# Patient Record
Sex: Female | Born: 1970 | Hispanic: Yes | Marital: Married | Smoking: Never smoker
Health system: Southern US, Community
[De-identification: ages and names within clinical notes are randomized; demographics above are authoritative.]

## PROBLEM LIST (undated history)

## (undated) ENCOUNTER — Inpatient Hospital Stay (HOSPITAL_COMMUNITY): Payer: Self-pay

## (undated) ENCOUNTER — Inpatient Hospital Stay (HOSPITAL_COMMUNITY): Payer: BC Managed Care – PPO

## (undated) DIAGNOSIS — Z789 Other specified health status: Secondary | ICD-10-CM

---

## 2012-01-18 LAB — OB RESULTS CONSOLE RUBELLA ANTIBODY, IGM: Rubella: IMMUNE

## 2012-01-26 LAB — OB RESULTS CONSOLE GC/CHLAMYDIA
Chlamydia: NEGATIVE
Gonorrhea: NEGATIVE

## 2012-06-01 ENCOUNTER — Observation Stay (HOSPITAL_COMMUNITY)
Admission: AD | Admit: 2012-06-01 | Discharge: 2012-06-03 | Disposition: A | Discharge status: Home / Self Care | Payer: BC Managed Care – PPO | Source: Ambulatory Visit | Attending: Obstetrics and Gynecology | Admitting: Obstetrics and Gynecology

## 2012-06-01 ENCOUNTER — Encounter (HOSPITAL_COMMUNITY): Payer: Self-pay

## 2012-06-01 DIAGNOSIS — O4690 Antepartum hemorrhage, unspecified, unspecified trimester: Principal | ICD-10-CM | POA: Diagnosis present

## 2012-06-01 HISTORY — DX: Other specified health status: Z78.9

## 2012-06-01 LAB — OB RESULTS CONSOLE ABO/RH: RH Type: POSITIVE

## 2012-06-01 LAB — OB RESULTS CONSOLE ANTIBODY SCREEN: Antibody Screen: NEGATIVE

## 2012-06-02 ENCOUNTER — Encounter (HOSPITAL_COMMUNITY): Payer: Self-pay

## 2012-06-02 ENCOUNTER — Inpatient Hospital Stay (HOSPITAL_COMMUNITY): Payer: BC Managed Care – PPO

## 2012-06-02 DIAGNOSIS — O4690 Antepartum hemorrhage, unspecified, unspecified trimester: Secondary | ICD-10-CM | POA: Diagnosis present

## 2012-06-02 LAB — URINALYSIS, ROUTINE W REFLEX MICROSCOPIC
Leukocytes, UA: NEGATIVE
Nitrite: NEGATIVE
Protein, ur: NEGATIVE mg/dL
Specific Gravity, Urine: <1.005 — ABNORMAL LOW (ref 1.005–1.030)
Urobilinogen, UA: 0.2 mg/dL (ref 0.0–1.0)

## 2012-06-02 LAB — CBC
Hemoglobin: 11.8 g/dL — ABNORMAL LOW (ref 12.0–15.0)
MCH: 32.4 pg (ref 26.0–34.0)
Platelets: 132 10*3/uL — ABNORMAL LOW (ref 150–400)
RBC: 3.64 MIL/uL — ABNORMAL LOW (ref 3.87–5.11)

## 2012-06-02 LAB — ABO/RH: ABO/RH(D): O POS

## 2012-06-02 LAB — URINE MICROSCOPIC-ADD ON

## 2012-06-02 LAB — GROUP B STREP BY PCR: Group B strep by PCR: NEGATIVE

## 2012-06-02 MED ORDER — LACTATED RINGERS IV SOLN
INTRAVENOUS | Status: DC
  Administered 2012-06-02 (×2): via INTRAVENOUS

## 2012-06-02 MED ORDER — BETAMETHASONE SOD PHOS & ACET 6 (3-3) MG/ML IJ SUSP
12.0000 mg | Freq: Once | INTRAMUSCULAR | Status: AC
Start: 2012-06-02 — End: 2012-06-02
  Administered 2012-06-02: 12 mg via INTRAMUSCULAR
  Filled 2012-06-02: qty 2

## 2012-06-02 MED ORDER — NIFEDIPINE 10 MG PO CAPS
10.0000 mg | ORAL_CAPSULE | Freq: Once | ORAL | Status: AC
  Administered 2012-06-02: 10 mg via ORAL
  Filled 2012-06-02: qty 1

## 2012-06-02 MED ORDER — BETAMETHASONE SOD PHOS & ACET 6 (3-3) MG/ML IJ SUSP
12.0000 mg | Freq: Once | INTRAMUSCULAR | Status: AC
  Administered 2012-06-03: 12 mg via INTRAMUSCULAR
  Filled 2012-06-02: qty 2

## 2012-06-02 MED ORDER — IBUPROFEN 600 MG PO TABS
600.0000 mg | ORAL_TABLET | Freq: Once | ORAL | Status: AC
  Administered 2012-06-02: 600 mg via ORAL
  Filled 2012-06-02: qty 1

## 2012-06-02 MED ORDER — ZOLPIDEM TARTRATE 5 MG PO TABS: 5.0000 mg | ORAL_TABLET | Freq: Every evening | ORAL | Status: DC | PRN

## 2012-06-02 MED ORDER — NIFEDIPINE 10 MG PO CAPS
10.0000 mg | ORAL_CAPSULE | ORAL | Status: DC
Start: 2012-06-02 — End: 2012-06-03
  Administered 2012-06-02 – 2012-06-03 (×8): 10 mg via ORAL
  Filled 2012-06-02 (×8): qty 1

## 2012-06-02 MED ORDER — IBUPROFEN 100 MG/5ML PO SUSP
600.0000 mg | Freq: Once | ORAL | Status: DC
  Filled 2012-06-02: qty 30

## 2012-06-02 NOTE — Progress Notes (Signed)
Updated that fetal fibronectin came back positive, orders received for Betamethazone

## 2012-06-02 NOTE — MAU Note (Signed)
Pt states saw red blood in underwear just before arriving at MAU. Denies contractions or pain. Denies leaking of fluid. Decreased fetal movement today.

## 2012-06-02 NOTE — H&P (Signed)
NAMESHAWNAE, LEIVA NO.:  [id number]  MEDICAL RECORD NO.:  [id number]  LOCATION:  9173                          FACILITY:  [facility]  PHYSICIAN:  Lenoard Aden, M.D.DATE OF BIRTH:  May 21, 1971  DATE OF ADMISSION:  06/01/2012 DATE OF DISCHARGE:                             HISTORY & PHYSICAL   CHIEF COMPLAINT:  One episode of bright red spotting in the evening of June 02, 2012.  No heavy bleeding, no leakage of fluid, good fetal movement.  HISTORY OF PRESENT ILLNESS:  She is a 41 year old female, G2, P1, history of previous low-segment transverse cesarean section, who presents at 29-6/7th weeks spotting of unknown origin.  She has no known drug allergies.  MEDICATIONS:  Prenatal vitamins.  She is a nonsmoker, nondrinker.  She denies domestic or physical violence.  She has had previous cesarean section done urgently at 38 weeks and prefers a repeat C-section with this pregnancy.  She has had no other medical or surgical hospitalizations.  PHYSICAL EXAMINATION:  GENERAL:  She is a well-developed, well-nourished female, in no acute distress. HEENT:  Normal. NECK:  Supple.  Full range of motion. LUNGS:  Clear. HEART:  Regular rate and rhythm. ABDOMEN:  Soft, gravid, nontender.  No CVA tenderness. EXTREMITIES:  There are no cords. NEUROLOGIC:  Nonfocal. PELVIC:  Per RN reveals no bleeding.  Cervix to be close, long, out of the pelvis.  Fetal heart rate tracings in the 140-150 beats per minute range with good accelerations, no decelerations.  Initially, contractions were every 5 minutes.  Now after Procardia tocolysis, the contractions have ceased.  Ultrasound revealed normal amniotic fluid index, normal-appearing placenta, and normal cervical length.  CBC and PIH panel are pending. Fetal fibronectin is positive.  Betamethasone #1 is given.  IMPRESSION: 1. A 29-6/7th-week intrauterine pregnancy. 2. Unexplained third-trimester bleeding, questionable  marginal     abruption versus preterm labor.  PLAN:  Betamethasone series.  We will consider magnesium sulfate, neuro prophylaxis, continuous EFM and toco.  Inpatient hospitalization at this time.  Continue Procardia tocolysis.  We will treat empirically group B strep at this time.     Lenoard Aden, M.D.     RJT/MEDQ  D:  06/02/2012  T:  06/02/2012  Job:  [id number]

## 2012-06-02 NOTE — Progress Notes (Signed)
Pt denies any bleeding, contractions. Denies any needs at this time.

## 2012-06-02 NOTE — Progress Notes (Signed)
Dr. Billy Coast called notified of pt stats and uc pattern, orders to collect fetal fibronectin, collect GBS, and check pt cervix. Orders for Procardia 10mg  PO q 4 hrs.

## 2012-06-02 NOTE — Progress Notes (Signed)
Antepartum note HD 1  S:  Reports feeling ok             Tolerating po/ No nausea or vomiting             Bleeding is only seen when wiping after BRP             No cramping or abdominal pain             BR with BRP  Small pink after yoga class Friday then onset red bleeding after intercourse last night  O:               VS: BP 101/43  Pulse 72  Temp 98 F (36.7 C) (Oral)  Resp 18  Ht 5\' 6"  (1.676 m)  Wt 72 kg (158 lb 11.7 oz)  BMI 25.62 kg/m2   LABS:  Basename 06/02/12 0206  WBC 8.9  HGB 11.8*  PLT 132*                                       Physical Exam:             Alert and oriented X3 / limited [place] - husband translating  Lungs: Clear and unlabored  Heart: regular rate and rhythm   Abdomen: soft, non-tender, non-distended              Fundus: non-tender / soft  Lochia: no bleeding on peripad                   FHR category 1 / no ctx per EFM     A: 29 6/[redacted] weeks pregnant             Bleeding in pregnancy - unidentified etiology at this time   Doing well - stable status  P:  Continue inpt observation             Complete BMZ course             Likely false fFn with blood and semen in vagina - consider recollection at office next week             MD to follow  Marlinda Mike CNM, MSN 06/02/2012, 9:29 AM

## 2012-06-02 NOTE — Progress Notes (Signed)
Patient ID: Koi Brandli, female   DOB: 07/10/1971, 41 y.o.   MRN: [id number] 29 6/7 weeks  S: Presents with one episode of Bright red spotting Good FM. Denies contractions. No LOF.  O: BP 101/43  Pulse 72  Temp 98 F (36.7 C) (Oral)  Resp 18  Ht 5\' 6"  (1.676 m)  Wt 72 kg (158 lb 11.7 oz)  BMI 25.62 kg/m2  Lgs: CTA CV: RRR ABD: Gravid  And non tender No CVAT EXT: neg c/c/e Neuro: nonfocal VE: CL/ LG/OOP Skin: intact  FHT 140-150, BTBV 5-25, 10x10 accels, rare contractions Sono- nl AFI, NL placenta, Nl CL CBC pending  A: 29 6/7 wk IUP Third trimester bleeding- No s/s abruptio. ? Marginal separation. ?PTL FFN positive  P: Will get complete OB sono Will ck DIC panel Will consider Neuroprophylaxis EFM/Toco BMZ series. Procardia

## 2012-06-02 NOTE — Progress Notes (Signed)
Patient ID: Julia Barrett, female   DOB: July 12, 1970, 41 y.o.   MRN: [id number] Discussed with patient. Pt had IC prior to bleeding. FFN possibly false positive. Will continue with plan of care. BMZ #2 tomorrow .  Will consider dc at that time.

## 2012-06-02 NOTE — Progress Notes (Signed)
Dr. Billy Coast called notified of pt status and uc pattern, orders received

## 2012-06-03 LAB — URINE MICROSCOPIC-ADD ON

## 2012-06-03 LAB — URINALYSIS, ROUTINE W REFLEX MICROSCOPIC
Glucose, UA: NEGATIVE mg/dL
Leukocytes, UA: NEGATIVE
Nitrite: NEGATIVE
Protein, ur: NEGATIVE mg/dL
pH: 6 (ref 5.0–8.0)

## 2012-06-03 MED ORDER — NITROFURANTOIN MONOHYD MACRO 100 MG PO CAPS: 100.0000 mg | ORAL_CAPSULE | Freq: Two times a day (BID) | ORAL | Status: DC

## 2012-06-03 MED ORDER — IBUPROFEN 600 MG PO TABS
600.0000 mg | ORAL_TABLET | Freq: Once | ORAL | Status: AC
  Administered 2012-06-03: 600 mg via ORAL
  Filled 2012-06-03: qty 1

## 2012-06-03 NOTE — Progress Notes (Signed)
Pt also requesting to have  UA done

## 2012-06-03 NOTE — Progress Notes (Signed)
Patient ID: Julia Barrett, female   DOB: 1971-05-05, 41 y.o.   MRN: [id number] HD#2 30 weeks Third trimester bleeding, pos FFN  S: Pos FM, Low back pain improved. NO bleeding or LOF No CP or SOB  O: BP 101/56  Pulse 74  Temp 98.2 F (36.8 C) (Oral)  Resp 18  Ht 5\' 6"  (1.676 m)  Wt 72 kg (158 lb 11.7 oz)  BMI 25.62 kg/m2  Lgs: CTA CV: RRR Abd: Gravid, NT Neg CVAT Ext: No c/c/e Pelvic : cl/50/-1 Brownish dc on glove No BRB noted  FHT 140-150, good BTBV, No decels, Rare Contractions Category I tracing  A: 30 week IUP Unexplained bleeding- pos FFN (?false pos due to blood) BMZ complete No evidence of PTL or abruptio  P: DC home Bedrest with BRP Send urine for CS Macrobid bid for 7d PTL precautions Bleeding precautions Fu office 3d

## 2012-06-03 NOTE — Progress Notes (Signed)
Taavon at bedside--SVE performed--discharge instructions discussed with pt and husband

## 2012-06-03 NOTE — Progress Notes (Signed)
Called dr Billy Coast, pt reguesting more ibuprofen and a UA, orders received.

## 2012-06-04 LAB — TYPE AND SCREEN
ABO/RH(D): O POS
Antibody Screen: NEGATIVE
Unit division: 0
Unit division: 0

## 2012-06-05 LAB — URINE CULTURE: Culture: NO GROWTH

## 2012-07-10 ENCOUNTER — Inpatient Hospital Stay (HOSPITAL_COMMUNITY)
Admission: AD | Admit: 2012-07-10 | Discharge: 2012-07-10 | Disposition: A | Discharge status: Home / Self Care | Payer: BC Managed Care – PPO | Source: Ambulatory Visit | Attending: Obstetrics | Admitting: Obstetrics

## 2012-07-10 ENCOUNTER — Encounter (HOSPITAL_COMMUNITY): Payer: Self-pay

## 2012-07-10 DIAGNOSIS — O4690 Antepartum hemorrhage, unspecified, unspecified trimester: Secondary | ICD-10-CM | POA: Insufficient documentation

## 2012-07-10 DIAGNOSIS — O99891 Other specified diseases and conditions complicating pregnancy: Secondary | ICD-10-CM | POA: Insufficient documentation

## 2012-07-10 LAB — AMNISURE RUPTURE OF MEMBRANE (ROM) NOT AT ARMC: Amnisure ROM: NEGATIVE

## 2012-07-10 NOTE — H&P (Signed)
Chief complaint: Vaginal discharge  History of present illness: 41 year old G2 P1 at 35 weeks and 2 days gestation who presents with  small fluid per vagina at 3 PM today. Patient reports no vaginal bleeding, no contractions, active fetal movement. Patient reports no unusual vaginal discharge but at 3 PM today had a single small gush of clear fluid. Patient notes no abdominal pain, no fevers. Patient states prenatal course is complicated by prior C-section planning repeat and unexplained third trimester vaginal bleeding for which she needed 2 days of hospitalization for earlier in this pregnancy. Patient has had no recurrence of her bleeding.  No known drug allergies  Social history. Married, from [place]. Nonsmoker, nondrinker  Physical exam: Filed Vitals:   07/10/12 1818 07/10/12 1839 07/10/12 1943  BP: 127/68 113/65 124/69  Pulse: 80 83 80  Temp: 98.7 F (37.1 C)    TempSrc: Oral    Resp: 16  20  Height: 5' 4.5" (1.638 m)    Weight: 76.295 kg (168 lb 3.2 oz)    SpO2: 99%     General: Comfortable, no distress Cardiovascular: Regular rate and rhythm, no murmur Pulmonary: Clear auscultation bilaterally Back: No costovertebral angle tenderness Abdomen: Gravid, nontender, no right upper quadrant pain GU: Normal vagina, no fluid in the vagina, no blood, cervix soft, fingertip, 20% effaced, vertex Lower extremity: No edema  Bedtime ultrasound: Anterior placenta, vertex presentation, grossly normal AFI, active fetal movement  And he is sure: Negative  Assessment and plan: 41 year old G2 P1 at 35 weeks and 2 days who presents with a single fluid leaking per vagina several hours ago. No evidence rupture of membranes. Reactive fetal testing, no evidence of active labor.  Discharge home reassurance given  Hampton Cost A. 07/10/2012 8:34 PM

## 2012-07-10 NOTE — MAU Note (Signed)
Patient is scheduled for a repeat cesarean on 1-28.

## 2012-07-10 NOTE — MAU Note (Signed)
Patient states she had a gush of clear fluid at 1600. Denies bleeding or contractions. States baby is moving but not as much as usual. Fetal heart tone in triage in the 130's with movement felt.

## 2012-07-11 NOTE — L&D Delivery Note (Signed)
Cesarean Section Procedure Note  Indications: previous uterine incision Kerr x 1, declines TOLAC  Pre-operative Diagnosis: 39 week 2 day pregnancy.  Post-operative Diagnosis: same  Surgeon: Genia Del   Assistants: Earl Gala  Anesthesia: Spinal anesthesia  ASA Class: 1   Procedure Details   The patient was seen in the Holding Room. The risks, benefits, complications, treatment options, and expected outcomes were discussed with the patient.  The patient concurred with the proposed plan, giving informed consent.  The site of surgery properly noted/marked. The patient was taken to Operating Room # 9, identified as Carriann Hesse and the procedure verified as C-Section Delivery. A Time Out was held and the above information confirmed.  After induction of anesthesia, the patient was draped and prepped in the usual sterile manner. A Pfannenstiel incision was made and carried down through the subcutaneous tissue to the fascia. Fascial incision was made and extended transversely. The fascia was separated from the underlying rectus tissue superiorly and inferiorly. The peritoneum was identified and entered. Peritoneal incision was extended longitudinally. The utero-vesical peritoneal reflection was incised transversely and the bladder flap was bluntly freed from the lower uterine segment. A low transverse uterine incision was made. Delivered from cephalic presentation at 12:05 was a  Female with Apgar scores of 9 at one minute and 10 at five minutes. After the umbilical cord was clamped and cut cord blood was obtained for evaluation. The placenta was removed intact and appeared normal. The uterine outline, tubes and ovaries appeared normal. The uterine incision was closed with running locked sutures of Vicryl 0.  A mattress suture of Vicryl 0 was added.  A figure of eight with Vicryl 0 was added for hemostasis on the left aspect of the uterine incision.  Hemostasis was observed. Lavage was  carried out until clear. 2 separate sutures of Vicryl 2-0 reapproximated the lower aspect of the recti muscles.  The fascia was then reapproximated with running sutures of Vicryl 0. The skin was reapproximated with Staples.  Instrument, sponge, and needle counts were correct prior the abdominal closure and at the conclusion of the case.    Estimated Blood Loss:  700 cc                Specimens: Placenta          Complications:  None; patient tolerated the procedure well.         Disposition: PACU - hemodynamically stable.         Condition: stable  Attending Attestation: I was present and scrubbed for the entire procedure.   Genia Del MD  08/07/2012 at 12:55

## 2012-07-24 ENCOUNTER — Other Ambulatory Visit: Payer: Self-pay | Admitting: Obstetrics & Gynecology

## 2012-07-27 ENCOUNTER — Other Ambulatory Visit (HOSPITAL_COMMUNITY): Payer: Self-pay | Admitting: Obstetrics & Gynecology

## 2012-07-27 DIAGNOSIS — O4100X0 Oligohydramnios, unspecified trimester, not applicable or unspecified: Secondary | ICD-10-CM

## 2012-07-27 DIAGNOSIS — O288 Other abnormal findings on antenatal screening of mother: Secondary | ICD-10-CM

## 2012-07-29 ENCOUNTER — Ambulatory Visit (HOSPITAL_COMMUNITY)
Admission: RE | Admit: 2012-07-29 | Discharge: 2012-07-29 | Disposition: A | Discharge status: Home / Self Care | Payer: BC Managed Care – PPO | Source: Ambulatory Visit | Attending: Obstetrics & Gynecology | Admitting: Obstetrics & Gynecology

## 2012-07-29 DIAGNOSIS — O288 Other abnormal findings on antenatal screening of mother: Secondary | ICD-10-CM

## 2012-07-29 DIAGNOSIS — O4190X0 Disorder of amniotic fluid and membranes, unspecified, unspecified trimester, not applicable or unspecified: Secondary | ICD-10-CM | POA: Insufficient documentation

## 2012-07-29 DIAGNOSIS — O4100X0 Oligohydramnios, unspecified trimester, not applicable or unspecified: Secondary | ICD-10-CM | POA: Insufficient documentation

## 2012-07-31 ENCOUNTER — Encounter (HOSPITAL_COMMUNITY): Payer: Self-pay

## 2012-08-01 ENCOUNTER — Encounter (HOSPITAL_COMMUNITY): Payer: Self-pay

## 2012-08-01 ENCOUNTER — Encounter (HOSPITAL_COMMUNITY)
Admission: RE | Admit: 2012-08-01 | Discharge: 2012-08-01 | Disposition: A | Discharge status: Home / Self Care | Payer: BC Managed Care – PPO | Source: Ambulatory Visit | Attending: Obstetrics & Gynecology | Admitting: Obstetrics & Gynecology

## 2012-08-01 ENCOUNTER — Encounter (HOSPITAL_COMMUNITY): Payer: Self-pay | Admitting: Pharmacist

## 2012-08-01 VITALS — BP 116/78 | Ht 64.5 in | Wt 170.0 lb

## 2012-08-01 DIAGNOSIS — O4690 Antepartum hemorrhage, unspecified, unspecified trimester: Secondary | ICD-10-CM

## 2012-08-01 LAB — CBC
Hemoglobin: 13.1 g/dL (ref 12.0–15.0)
MCH: 32.2 pg (ref 26.0–34.0)
MCHC: 34.6 g/dL (ref 30.0–36.0)
Platelets: 152 10*3/uL (ref 150–400)

## 2012-08-01 LAB — TYPE AND SCREEN: Antibody Screen: NEGATIVE

## 2012-08-01 LAB — SURGICAL PCR SCREEN: MRSA, PCR: NEGATIVE

## 2012-08-01 NOTE — Patient Instructions (Addendum)
20 Livy Malson  08/01/2012   Your procedure is scheduled on:  08/07/12  Enter through the Main Entrance of [facility] at 1000 AM.  Pick up the phone at the desk and dial [phone].   Call this number if you have problems the morning of surgery: [phone]   Remember:   Do not eat food:After Midnight.  Do not drink clear liquids: After Midnight.  Take these medicines the morning of surgery with A SIP OF WATER: NA   Do not wear jewelry, make-up or nail polish.  Do not wear lotions, powders, or perfumes. You may wear deodorant.  Do not shave 48 hours prior to surgery.  Do not bring valuables to the hospital.  Contacts, dentures or bridgework may not be worn into surgery.  Leave suitcase in the car. After surgery it may be brought to your room.  For patients admitted to the hospital, checkout time is 11:00 AM the day of discharge.   Patients discharged the day of surgery will not be allowed to drive home.  Name and phone number of your driver: NA  Special Instructions: Shower using CHG 2 nights before surgery and the night before surgery.  If you shower the day of surgery use CHG.  Use special wash - you have one bottle of CHG for all showers.  You should use approximately 1/3 of the bottle for each shower.   Please read over the following fact sheets that you were given: MRSA Information

## 2012-08-02 LAB — RPR: RPR Ser Ql: NONREACTIVE

## 2012-08-07 ENCOUNTER — Encounter (HOSPITAL_COMMUNITY): Payer: Self-pay | Admitting: *Deleted

## 2012-08-07 ENCOUNTER — Encounter (HOSPITAL_COMMUNITY)
Admission: RE | Disposition: A | Discharge status: Home / Self Care | Payer: Self-pay | Source: Ambulatory Visit | Attending: Obstetrics & Gynecology

## 2012-08-07 ENCOUNTER — Inpatient Hospital Stay (HOSPITAL_COMMUNITY)
Admission: RE | Admit: 2012-08-07 | Discharge: 2012-08-09 | DRG: 371 | Disposition: A | Discharge status: Home / Self Care | Payer: BC Managed Care – PPO | Source: Ambulatory Visit | Attending: Obstetrics & Gynecology | Admitting: Obstetrics & Gynecology

## 2012-08-07 ENCOUNTER — Inpatient Hospital Stay (HOSPITAL_COMMUNITY): Payer: BC Managed Care – PPO | Admitting: Anesthesiology

## 2012-08-07 ENCOUNTER — Encounter (HOSPITAL_COMMUNITY): Payer: Self-pay | Admitting: Anesthesiology

## 2012-08-07 DIAGNOSIS — O4690 Antepartum hemorrhage, unspecified, unspecified trimester: Secondary | ICD-10-CM

## 2012-08-07 DIAGNOSIS — Z98891 History of uterine scar from previous surgery: Secondary | ICD-10-CM | POA: Diagnosis present

## 2012-08-07 DIAGNOSIS — O34219 Maternal care for unspecified type scar from previous cesarean delivery: Principal | ICD-10-CM | POA: Diagnosis present

## 2012-08-07 DIAGNOSIS — O9902 Anemia complicating childbirth: Secondary | ICD-10-CM | POA: Diagnosis present

## 2012-08-07 DIAGNOSIS — O09529 Supervision of elderly multigravida, unspecified trimester: Secondary | ICD-10-CM | POA: Diagnosis present

## 2012-08-07 LAB — PREPARE RBC (CROSSMATCH)

## 2012-08-07 SURGERY — Surgical Case
Anesthesia: Spinal | Site: Abdomen | Wound class: Clean Contaminated

## 2012-08-07 MED ORDER — OXYCODONE-ACETAMINOPHEN 5-325 MG PO TABS
1.0000 | ORAL_TABLET | ORAL | Status: DC | PRN
  Administered 2012-08-08 – 2012-08-09 (×4): 1 via ORAL
  Filled 2012-08-07 (×4): qty 1

## 2012-08-07 MED ORDER — PHENYLEPHRINE 40 MCG/ML (10ML) SYRINGE FOR IV PUSH (FOR BLOOD PRESSURE SUPPORT)
PREFILLED_SYRINGE | INTRAVENOUS | Status: AC
  Filled 2012-08-07: qty 5

## 2012-08-07 MED ORDER — NALOXONE HCL 0.4 MG/ML IJ SOLN: 0.4000 mg | INTRAMUSCULAR | Status: DC | PRN

## 2012-08-07 MED ORDER — PROMETHAZINE HCL 25 MG/ML IJ SOLN: 6.2500 mg | INTRAMUSCULAR | Status: DC | PRN

## 2012-08-07 MED ORDER — PRENATAL MULTIVITAMIN CH
1.0000 | ORAL_TABLET | Freq: Every day | ORAL | Status: DC
  Administered 2012-08-08 – 2012-08-09 (×2): 1 via ORAL
  Filled 2012-08-07 (×2): qty 1

## 2012-08-07 MED ORDER — MORPHINE SULFATE (PF) 0.5 MG/ML IJ SOLN
INTRAMUSCULAR | Status: DC | PRN
  Administered 2012-08-07: .15 mg via INTRATHECAL

## 2012-08-07 MED ORDER — LACTATED RINGERS IV SOLN
INTRAVENOUS | Status: DC
  Administered 2012-08-07: 11:00:00 via INTRAVENOUS

## 2012-08-07 MED ORDER — OXYTOCIN 40 UNITS IN LACTATED RINGERS INFUSION - SIMPLE MED: 62.5000 mL/h | INTRAVENOUS | Status: AC

## 2012-08-07 MED ORDER — SCOPOLAMINE 1 MG/3DAYS TD PT72
MEDICATED_PATCH | TRANSDERMAL | Status: AC
  Administered 2012-08-07: 1.5 mg via TRANSDERMAL
  Filled 2012-08-07: qty 1

## 2012-08-07 MED ORDER — SCOPOLAMINE 1 MG/3DAYS TD PT72
1.0000 | MEDICATED_PATCH | Freq: Once | TRANSDERMAL | Status: DC
  Administered 2012-08-07: 1.5 mg via TRANSDERMAL

## 2012-08-07 MED ORDER — LACTATED RINGERS IV SOLN
Freq: Once | INTRAVENOUS | Status: AC
  Administered 2012-08-07: 10:00:00 via INTRAVENOUS

## 2012-08-07 MED ORDER — LANOLIN HYDROUS EX OINT: 1.0000 "application " | TOPICAL_OINTMENT | CUTANEOUS | Status: DC | PRN

## 2012-08-07 MED ORDER — EPHEDRINE 5 MG/ML INJ
INTRAVENOUS | Status: AC
  Filled 2012-08-07: qty 10

## 2012-08-07 MED ORDER — HYDROMORPHONE HCL PF 1 MG/ML IJ SOLN: 0.2500 mg | INTRAMUSCULAR | Status: DC | PRN

## 2012-08-07 MED ORDER — MEPERIDINE HCL 25 MG/ML IJ SOLN: 6.2500 mg | INTRAMUSCULAR | Status: DC | PRN

## 2012-08-07 MED ORDER — CEFAZOLIN SODIUM-DEXTROSE 2-3 GM-% IV SOLR
INTRAVENOUS | Status: AC
  Filled 2012-08-07: qty 50

## 2012-08-07 MED ORDER — NALBUPHINE HCL 10 MG/ML IJ SOLN
5.0000 mg | INTRAMUSCULAR | Status: DC | PRN
Start: 2012-08-07 — End: 2012-08-09
  Administered 2012-08-08: 5 mg via INTRAVENOUS
  Filled 2012-08-07 (×2): qty 1

## 2012-08-07 MED ORDER — KETOROLAC TROMETHAMINE 60 MG/2ML IM SOLN
60.0000 mg | Freq: Once | INTRAMUSCULAR | Status: AC | PRN
  Administered 2012-08-07: 60 mg via INTRAMUSCULAR

## 2012-08-07 MED ORDER — EPHEDRINE SULFATE 50 MG/ML IJ SOLN
INTRAMUSCULAR | Status: DC | PRN
  Administered 2012-08-07: 10 mg via INTRAVENOUS
  Administered 2012-08-07 (×4): 5 mg via INTRAVENOUS

## 2012-08-07 MED ORDER — SODIUM CHLORIDE 0.9 % IJ SOLN: 3.0000 mL | INTRAMUSCULAR | Status: DC | PRN

## 2012-08-07 MED ORDER — MENTHOL 3 MG MT LOZG: 1.0000 | LOZENGE | OROMUCOSAL | Status: DC | PRN

## 2012-08-07 MED ORDER — ONDANSETRON HCL 4 MG/2ML IJ SOLN: 4.0000 mg | INTRAMUSCULAR | Status: DC | PRN

## 2012-08-07 MED ORDER — TETANUS-DIPHTH-ACELL PERTUSSIS 5-2.5-18.5 LF-MCG/0.5 IM SUSP: 0.5000 mL | Freq: Once | INTRAMUSCULAR | Status: DC

## 2012-08-07 MED ORDER — ONDANSETRON HCL 4 MG/2ML IJ SOLN
INTRAMUSCULAR | Status: AC
  Filled 2012-08-07: qty 2

## 2012-08-07 MED ORDER — LACTATED RINGERS IV SOLN
INTRAVENOUS | Status: DC
  Administered 2012-08-07 – 2012-08-08 (×2): via INTRAVENOUS

## 2012-08-07 MED ORDER — PHENYLEPHRINE HCL 10 MG/ML IJ SOLN
INTRAMUSCULAR | Status: DC | PRN
  Administered 2012-08-07 (×3): 40 ug via INTRAVENOUS

## 2012-08-07 MED ORDER — CEFAZOLIN SODIUM-DEXTROSE 2-3 GM-% IV SOLR
2.0000 g | INTRAVENOUS | Status: AC
  Administered 2012-08-07: 2 g via INTRAVENOUS

## 2012-08-07 MED ORDER — MAGNESIUM HYDROXIDE 400 MG/5ML PO SUSP: 30.0000 mL | ORAL | Status: DC | PRN

## 2012-08-07 MED ORDER — IBUPROFEN 600 MG PO TABS
600.0000 mg | ORAL_TABLET | Freq: Four times a day (QID) | ORAL | Status: DC
  Administered 2012-08-08 – 2012-08-09 (×6): 600 mg via ORAL
  Filled 2012-08-07 (×6): qty 1

## 2012-08-07 MED ORDER — DIBUCAINE 1 % RE OINT: 1.0000 "application " | TOPICAL_OINTMENT | RECTAL | Status: DC | PRN

## 2012-08-07 MED ORDER — DIPHENHYDRAMINE HCL 25 MG PO CAPS: 25.0000 mg | ORAL_CAPSULE | Freq: Four times a day (QID) | ORAL | Status: DC | PRN

## 2012-08-07 MED ORDER — BUPIVACAINE HCL (PF) 0.25 % IJ SOLN
INTRAMUSCULAR | Status: AC
  Filled 2012-08-07: qty 30

## 2012-08-07 MED ORDER — WITCH HAZEL-GLYCERIN EX PADS: 1.0000 "application " | MEDICATED_PAD | CUTANEOUS | Status: DC | PRN

## 2012-08-07 MED ORDER — ONDANSETRON HCL 4 MG PO TABS: 4.0000 mg | ORAL_TABLET | ORAL | Status: DC | PRN

## 2012-08-07 MED ORDER — NALBUPHINE HCL 10 MG/ML IJ SOLN
5.0000 mg | INTRAMUSCULAR | Status: DC | PRN
  Filled 2012-08-07: qty 1

## 2012-08-07 MED ORDER — DIPHENHYDRAMINE HCL 50 MG/ML IJ SOLN
12.5000 mg | INTRAMUSCULAR | Status: DC | PRN
  Administered 2012-08-08: 12.5 mg via INTRAVENOUS
  Filled 2012-08-07: qty 1

## 2012-08-07 MED ORDER — DEXTROSE IN LACTATED RINGERS 5 % IV SOLN: INTRAVENOUS | Status: DC

## 2012-08-07 MED ORDER — KETOROLAC TROMETHAMINE 30 MG/ML IJ SOLN: 15.0000 mg | Freq: Once | INTRAMUSCULAR | Status: DC | PRN

## 2012-08-07 MED ORDER — OXYTOCIN 40 UNITS IN LACTATED RINGERS INFUSION - SIMPLE MED
INTRAVENOUS | Status: DC | PRN
  Administered 2012-08-07: 40 [IU] via INTRAVENOUS

## 2012-08-07 MED ORDER — KETOROLAC TROMETHAMINE 30 MG/ML IJ SOLN: 30.0000 mg | Freq: Four times a day (QID) | INTRAMUSCULAR | Status: AC | PRN

## 2012-08-07 MED ORDER — SENNOSIDES-DOCUSATE SODIUM 8.6-50 MG PO TABS
2.0000 | ORAL_TABLET | Freq: Every day | ORAL | Status: DC
  Administered 2012-08-07 – 2012-08-08 (×2): 2 via ORAL

## 2012-08-07 MED ORDER — SIMETHICONE 80 MG PO CHEW
80.0000 mg | CHEWABLE_TABLET | Freq: Three times a day (TID) | ORAL | Status: DC
  Administered 2012-08-07 – 2012-08-09 (×5): 80 mg via ORAL

## 2012-08-07 MED ORDER — LACTATED RINGERS IV SOLN
INTRAVENOUS | Status: DC | PRN
  Administered 2012-08-07: 12:00:00 via INTRAVENOUS

## 2012-08-07 MED ORDER — FERROUS SULFATE 325 (65 FE) MG PO TABS: 325.0000 mg | ORAL_TABLET | Freq: Two times a day (BID) | ORAL | Status: DC

## 2012-08-07 MED ORDER — ONDANSETRON HCL 4 MG/2ML IJ SOLN: 4.0000 mg | Freq: Three times a day (TID) | INTRAMUSCULAR | Status: DC | PRN

## 2012-08-07 MED ORDER — BUPIVACAINE IN DEXTROSE 0.75-8.25 % IT SOLN
INTRATHECAL | Status: DC | PRN
  Administered 2012-08-07: 1.5 mL via INTRATHECAL

## 2012-08-07 MED ORDER — ZOLPIDEM TARTRATE 5 MG PO TABS: 5.0000 mg | ORAL_TABLET | Freq: Every evening | ORAL | Status: DC | PRN

## 2012-08-07 MED ORDER — METOCLOPRAMIDE HCL 5 MG/ML IJ SOLN: 10.0000 mg | Freq: Three times a day (TID) | INTRAMUSCULAR | Status: DC | PRN

## 2012-08-07 MED ORDER — FENTANYL CITRATE 0.05 MG/ML IJ SOLN
INTRAMUSCULAR | Status: DC | PRN
  Administered 2012-08-07: 25 ug via INTRATHECAL

## 2012-08-07 MED ORDER — LACTATED RINGERS IV SOLN
INTRAVENOUS | Status: DC | PRN
  Administered 2012-08-07 (×2): via INTRAVENOUS

## 2012-08-07 MED ORDER — DIPHENHYDRAMINE HCL 50 MG/ML IJ SOLN: 25.0000 mg | INTRAMUSCULAR | Status: DC | PRN

## 2012-08-07 MED ORDER — NALOXONE HCL 1 MG/ML IJ SOLN
1.0000 ug/kg/h | INTRAVENOUS | Status: DC | PRN
  Filled 2012-08-07: qty 2

## 2012-08-07 MED ORDER — DIPHENHYDRAMINE HCL 25 MG PO CAPS
25.0000 mg | ORAL_CAPSULE | ORAL | Status: DC | PRN
  Filled 2012-08-07: qty 1

## 2012-08-07 MED ORDER — KETOROLAC TROMETHAMINE 60 MG/2ML IM SOLN
INTRAMUSCULAR | Status: AC
  Administered 2012-08-07: 60 mg via INTRAMUSCULAR
  Filled 2012-08-07: qty 2

## 2012-08-07 MED ORDER — FENTANYL CITRATE 0.05 MG/ML IJ SOLN
INTRAMUSCULAR | Status: AC
  Filled 2012-08-07: qty 2

## 2012-08-07 MED ORDER — OXYTOCIN 10 UNIT/ML IJ SOLN
INTRAMUSCULAR | Status: AC
  Filled 2012-08-07: qty 4

## 2012-08-07 MED ORDER — SIMETHICONE 80 MG PO CHEW: 80.0000 mg | CHEWABLE_TABLET | ORAL | Status: DC | PRN

## 2012-08-07 MED ORDER — BUPIVACAINE HCL (PF) 0.25 % IJ SOLN
INTRAMUSCULAR | Status: DC | PRN
  Administered 2012-08-07: 10 mL

## 2012-08-07 MED ORDER — ONDANSETRON HCL 4 MG/2ML IJ SOLN
INTRAMUSCULAR | Status: DC | PRN
  Administered 2012-08-07: 4 mg via INTRAVENOUS

## 2012-08-07 MED ORDER — MORPHINE SULFATE 0.5 MG/ML IJ SOLN
INTRAMUSCULAR | Status: AC
  Filled 2012-08-07: qty 10

## 2012-08-07 SURGICAL SUPPLY — 33 items
CLOTH BEACON ORANGE TIMEOUT ST (SAFETY) ×2 IMPLANT
CONTAINER PREFILL 10% NBF 15ML (MISCELLANEOUS) IMPLANT
DRAPE LG THREE QUARTER DISP (DRAPES) ×2 IMPLANT
DRSG OPSITE POSTOP 4X10 (GAUZE/BANDAGES/DRESSINGS) ×2 IMPLANT
DURAPREP 26ML APPLICATOR (WOUND CARE) ×2 IMPLANT
ELECT REM PT RETURN 9FT ADLT (ELECTROSURGICAL) ×2
ELECTRODE REM PT RTRN 9FT ADLT (ELECTROSURGICAL) ×1 IMPLANT
EXTRACTOR VACUUM M CUP 4 TUBE (SUCTIONS) IMPLANT
GLOVE BIO SURGEON STRL SZ 6.5 (GLOVE) ×4 IMPLANT
GLOVE BIOGEL PI IND STRL 7.0 (GLOVE) ×1 IMPLANT
GLOVE BIOGEL PI INDICATOR 7.0 (GLOVE) ×1
GOWN PREVENTION PLUS LG XLONG (DISPOSABLE) ×6 IMPLANT
KIT ABG SYR 3ML LUER SLIP (SYRINGE) IMPLANT
NEEDLE HYPO 22GX1.5 SAFETY (NEEDLE) ×2 IMPLANT
NEEDLE HYPO 25X5/8 SAFETYGLIDE (NEEDLE) IMPLANT
PACK C SECTION WH (CUSTOM PROCEDURE TRAY) ×2 IMPLANT
PAD OB MATERNITY 4.3X12.25 (PERSONAL CARE ITEMS) ×2 IMPLANT
RTRCTR C-SECT PINK 25CM LRG (MISCELLANEOUS) IMPLANT
SLEEVE SCD COMPRESS KNEE MED (MISCELLANEOUS) IMPLANT
STAPLER VISISTAT 35W (STAPLE) ×2 IMPLANT
SUT PLAIN 0 NONE (SUTURE) IMPLANT
SUT VIC AB 0 CT1 27 (SUTURE) ×2
SUT VIC AB 0 CT1 27XBRD ANBCTR (SUTURE) ×2 IMPLANT
SUT VIC AB 0 CTX 36 (SUTURE) ×3
SUT VIC AB 0 CTX36XBRD ANBCTRL (SUTURE) ×3 IMPLANT
SUT VIC AB 2-0 CT1 27 (SUTURE) ×1
SUT VIC AB 2-0 CT1 TAPERPNT 27 (SUTURE) ×1 IMPLANT
SUT VIC AB 3-0 SH 27 (SUTURE)
SUT VIC AB 3-0 SH 27X BRD (SUTURE) IMPLANT
SYR CONTROL 10ML LL (SYRINGE) ×2 IMPLANT
TOWEL OR 17X24 6PK STRL BLUE (TOWEL DISPOSABLE) ×6 IMPLANT
TRAY FOLEY CATH 14FR (SET/KITS/TRAYS/PACK) ×2 IMPLANT
WATER STERILE IRR 1000ML POUR (IV SOLUTION) IMPLANT

## 2012-08-07 NOTE — Transfer of Care (Signed)
Immediate Anesthesia Transfer of Care Note  Patient: Julia Barrett  Procedure(s) Performed: Procedure(s) (LRB) with comments: CESAREAN SECTION (N/A) - Repeat C/S  EDD: 08/12/12  Patient Location: Mother/Baby  Anesthesia Type:Spinal  Level of Consciousness: awake  Airway & Oxygen Therapy: Patient Spontanous Breathing  Post-op Assessment: Post -op Vital signs reviewed and stable  Post vital signs: stable  Complications: No apparent anesthesia complications

## 2012-08-07 NOTE — Transfer of Care (Signed)
Immediate Anesthesia Transfer of Care Note  Patient: Julia Barrett  Procedure(s) Performed: Procedure(s) (LRB) with comments: CESAREAN SECTION (N/A) - Repeat C/S  EDD: 08/12/12  Patient Location: PACU  Anesthesia Type:Spinal  Level of Consciousness: awake, alert  and oriented  Airway & Oxygen Therapy: Patient Spontanous Breathing  Post-op Assessment: Report given to PACU RN and Post -op Vital signs reviewed and stable  Post vital signs: Reviewed and stable  Complications: No apparent anesthesia complications

## 2012-08-07 NOTE — Anesthesia Preprocedure Evaluation (Signed)
Anesthesia Evaluation  Patient identified by MRN, date of birth, ID band Patient awake    Reviewed: Allergy & Precautions, H&P , NPO status , Patient's Chart, lab work & pertinent test results  Airway Mallampati: I TM Distance: >3 FB Neck ROM: full    Dental No notable dental hx.    Pulmonary neg pulmonary ROS,    Pulmonary exam normal       Cardiovascular negative cardio ROS      Neuro/Psych negative neurological ROS  negative psych ROS   GI/Hepatic negative GI ROS, Neg liver ROS,   Endo/Other  negative endocrine ROS  Renal/GU negative Renal ROS  negative genitourinary   Musculoskeletal negative musculoskeletal ROS (+)   Abdominal Normal abdominal exam  (+)   Peds negative pediatric ROS (+)  Hematology negative hematology ROS (+)   Anesthesia Other Findings   Reproductive/Obstetrics (+) Pregnancy                           Anesthesia Physical Anesthesia Plan  ASA: II  Anesthesia Plan: Spinal   Post-op Pain Management:    Induction:   Airway Management Planned:   Additional Equipment:   Intra-op Plan:   Post-operative Plan:   Informed Consent: I have reviewed the patients History and Physical, chart, labs and discussed the procedure including the risks, benefits and alternatives for the proposed anesthesia with the patient or authorized representative who has indicated his/her understanding and acceptance.     Plan Discussed with: CRNA and Surgeon  Anesthesia Plan Comments:         Anesthesia Quick Evaluation

## 2012-08-07 NOTE — Anesthesia Postprocedure Evaluation (Signed)
  Anesthesia Post-op Note  Patient: Julia Barrett  Procedure(s) Performed: Procedure(s) (LRB) with comments: CESAREAN SECTION (N/A) - Repeat C/S  EDD: 08/12/12  Patient Location: PACU  Anesthesia Type:Spinal  Level of Consciousness: awake, alert  and oriented  Airway and Oxygen Therapy: Patient Spontanous Breathing  Post-op Pain: none  Post-op Assessment: Post-op Vital signs reviewed, Patient's Cardiovascular Status Stable, Respiratory Function Stable, Patent Airway, No signs of Nausea or vomiting, Pain level controlled, No headache, No backache and No residual numbness  Post-op Vital Signs: Reviewed and stable  Complications: No apparent anesthesia complications

## 2012-08-07 NOTE — H&P (Signed)
Julia Barrett is a 42 y.o. female G72P1001 [redacted]w[redacted]d presenting for repeat C/S.  OB History    Grav Para Term Preterm Abortions TAB SAB Ect Mult Living   2 1 1       1      Past Medical History  Diagnosis Date  . No pertinent past medical history    Past Surgical History  Procedure Date  . Cesarean section    Family History: family history is negative for Other. Social History:  reports that she has never smoked. She has never used smokeless tobacco. She reports that she does not drink alcohol or use illicit drugs. Current facility-administered medications:ceFAZolin (ANCEF) 2-3 GM-% IVPB SOLR, , , , ;  ceFAZolin (ANCEF) IVPB 2 g/50 mL premix, 2 g, Intravenous, On Call to OR, Genia Del, MD;  lactated ringers infusion, , Intravenous, Continuous, John Linward Foster., MD, Last Rate: 125 mL/hr at 08/07/12 1041;  scopolamine (TRANSDERM-SCOP) 1.5 MG 1.5 mg, 1 patch, Transdermal, Once, Sandrea Hughs., MD, 1.5 mg at 08/07/12 1000 No Known Allergies    Blood pressure 120/71, pulse 78, temperature 98.2 F (36.8 C), temperature source Oral, resp. rate 16, height 5' 4.5" (1.638 m), weight 77.111 kg (170 lb), SpO2 99.00%.   HPP:  Patient Active Problem List  Diagnosis  . Vaginal bleeding in pregnancy    Prenatal labs: ABO, Rh: --/--/O POS (01/28 0981) Antibody: NEG (01/28 0952) Rubella:  Immune RPR: NON REACTIVE (01/22 1415)  HBsAg: Negative (07/10 0000)  HIV: Non-reactive (07/10 0000)  Genetic testing: [facility] wnl [place] anato: wnl 1 hr GTT: wnl GBS: Negative (11/23 0000)   Assessment/Plan:  39+ wks for repeat C/S.  AMA, [facility]ony wnl.  Surgery and risks reviewed.  Julia Barrett,MARIE-LYNE 08/07/2012, 11:24 AM

## 2012-08-07 NOTE — Anesthesia Procedure Notes (Signed)
Spinal  Patient location during procedure: OR Start time: 08/07/2012 11:38 AM Staffing Anesthesiologist: Mavin Dyke A. Performed by: anesthesiologist  Preanesthetic Checklist Completed: patient identified, site marked, surgical consent, pre-op evaluation, timeout performed, IV checked, risks and benefits discussed and monitors and equipment checked Spinal Block Patient position: sitting Prep: site prepped and draped and DuraPrep Patient monitoring: heart rate, cardiac monitor, continuous pulse ox and blood pressure Approach: midline Location: L3-4 Injection technique: single-shot Needle Needle type: Sprotte  Needle gauge: 24 G Needle length: 9 cm Needle insertion depth: 4 cm Assessment Sensory level: T4 Additional Notes Patient tolerated procedure well. Adequate sensory level.

## 2012-08-07 NOTE — Addendum Note (Signed)
Addendum  created 08/07/12 1728 by Renford Dills, CRNA   Modules edited:Notes Section

## 2012-08-08 ENCOUNTER — Encounter (HOSPITAL_COMMUNITY): Payer: Self-pay | Admitting: Obstetrics & Gynecology

## 2012-08-08 DIAGNOSIS — O9902 Anemia complicating childbirth: Secondary | ICD-10-CM | POA: Diagnosis present

## 2012-08-08 DIAGNOSIS — Z98891 History of uterine scar from previous surgery: Secondary | ICD-10-CM | POA: Diagnosis present

## 2012-08-08 LAB — CBC
Hemoglobin: 10.7 g/dL — ABNORMAL LOW (ref 12.0–15.0)
MCHC: 33.9 g/dL (ref 30.0–36.0)
RDW: 13.3 % (ref 11.5–15.5)
WBC: 10.2 10*3/uL (ref 4.0–10.5)

## 2012-08-08 MED ORDER — POLYSACCHARIDE IRON COMPLEX 150 MG PO CAPS
150.0000 mg | ORAL_CAPSULE | Freq: Every day | ORAL | Status: DC
  Administered 2012-08-08: 150 mg via ORAL
  Filled 2012-08-08 (×3): qty 1

## 2012-08-08 NOTE — Progress Notes (Addendum)
Subjective: Postpartum Day 1: Cesarean Delivery - scheduled RLTCS (Elective) Patient reports incisional pain, tolerating PO, + flatus and no problems voiding.  no complaints, up ad lib without syncope Pain well controlled with po meds BF: On Demand  Peds will need to complete Cardiac Echo on baby . Required for fully investigation of Clinical examination post delivery yesterday. Trying to schdeule for today. Mood stable, bonding well.  Objective: Vital signs in last 24 hours: Temp:  [97.2 F (36.2 C)-98.2 F (36.8 C)] 97.2 F (36.2 C) (01/29 0411) Pulse Rate:  [61-88] 72  (01/29 0411) Resp:  [16-20] 16  (01/29 0411) BP: (98-129)/(45-80) 99/61 mmHg (01/29 0411) SpO2:  [95 %-99 %] 96 % (01/29 0411) Weight:  [170 lb (77.111 kg)] 170 lb (77.111 kg) (01/28 1007)  Physical Exam:  General: alert, cooperative and no distress Breasts: N/T Heart: RRR Lungs: CTAB Abdomen: BS x4, soft. Uterine Fundus: firm, -3/u Incision: healing well, no significant drainage, no dehiscence, no significant erythema. Lochia: appropriate, rubra, min. DVT Evaluation: No evidence of DVT seen on physical exam. Negative Homan's sign. No cords or calf tenderness. No significant calf/ankle edema. Anti emboli stockings in place    [facility] 08/08/12 0530  HGB 10.7*  HCT 31.6*   Mild anemia of Pregnancy Commenced on Niferex 150 mg po daily  Assessment/Plan: Status post Cesarean section. Doing well postoperatively.  Continue current care. Cardiac Echo for Baby - to be scheduled.   Julia Barrett 08/08/2012, 9:57 AM

## 2012-08-09 MED ORDER — IBUPROFEN 600 MG PO TABS: 600.0000 mg | ORAL_TABLET | Freq: Four times a day (QID) | ORAL | Status: DC | PRN

## 2012-08-09 MED ORDER — SENNOSIDES-DOCUSATE SODIUM 8.6-50 MG PO TABS: 2.0000 | ORAL_TABLET | Freq: Every day | ORAL | Status: DC

## 2012-08-09 MED ORDER — OXYCODONE-ACETAMINOPHEN 5-325 MG PO TABS: 1.0000 | ORAL_TABLET | ORAL | Status: DC | PRN

## 2012-08-09 MED ORDER — POLYSACCHARIDE IRON COMPLEX 150 MG PO CAPS: 150.0000 mg | ORAL_CAPSULE | Freq: Every day | ORAL | Status: DC

## 2012-08-09 NOTE — Discharge Summary (Signed)
Physician Discharge Summary  Patient ID: Julia Barrett MRN: [id number] DOB/AGE: 04/08/1971 42 y.o.  Admit date: 08/07/2012 Discharge date: 08/09/2012  Admission Diagnoses: Admission for RLTCS - scheduled.  Discharge Diagnoses:  Principal Problem:  *Status post repeat low transverse cesarean section Active Problems:  Anemia of mother in pregnancy, delivered   Discharged Condition: stable  Hospital Course: Uncomplicated course with good recovery from RLTCS.  Consults: cardiology - baby had peds Cardiac Echo D 1 Post partum which was negative.  Significant Diagnostic Studies: labs: Routine PN labs - normal and radiology: Ultrasound: anatomy - normal  Treatments: IV hydration, antibiotics: Ancef and analgesia: acetaminophen w/ codeine and Ibuprofen  Discharge Exam: Blood pressure 110/74, pulse 74, temperature 97.4 F (36.3 C), temperature source Oral, resp. rate 18, height 5' 4.5" (1.638 m), weight 170 lb (77.111 kg), SpO2 98.00%, unknown if currently breastfeeding.  Physical Examination: Affect: AAO x 3 Lungs: CTAB Breasts: N/T CV: RRR Abdomen: Soft,. N/T, B/S x 4,  Flatus, no BM as yet Incision: Dry and intact - clear dressing remains - staples to skin - to return to office for staples removal x 7 days with wound check. Lochia: Rubra, min GI: normal diet tolerated well GU: no problems voiding Extremities: Feet Bilaterally +1 edema. All else to edema'/ swelling bilaterally.   Disposition: 01-Home or Self Care  Discharge Orders    Future Orders Please Complete By Expires   Diet general      Discharge instructions      Comments:   Per [facility] Booklet       Medication List     As of 08/09/2012 10:23 AM    TAKE these medications         ibuprofen 600 MG tablet   Commonly known as: ADVIL,MOTRIN   Take 1 tablet (600 mg total) by mouth every 6 (six) hours as needed for pain.      iron polysaccharides 150 MG capsule   Commonly known as: NIFEREX   Take 1 capsule  (150 mg total) by mouth daily.      loratadine 10 MG tablet   Commonly known as: CLARITIN   Take 10 mg by mouth daily as needed. For allergies      OVER THE COUNTER MEDICATION   Take 1 each by mouth daily as needed. Sore throat spray      oxyCODONE-acetaminophen 5-325 MG per tablet   Commonly known as: PERCOCET/ROXICET   Take 1-2 tablets by mouth every 4 (four) hours as needed (moderate - severe pain).      prenatal multivitamin Tabs   Take 1 tablet by mouth at bedtime.      senna-docusate 8.6-50 MG per tablet   Commonly known as: Senokot-S   Take 2 tablets by mouth at bedtime.           Follow-up Information    Follow up with [facility] Schedule an appointment as soon as possible for a visit in 7 days. (to return to offive x 7 dyas for staples removal and to make appointment fpr Post partum care x 6 weeks post surgery/ as needed)    Contact information:   [address] [place] [address] [phone]         Signed: Earl Gala 08/09/2012, 10:23 AM

## 2012-08-09 NOTE — Progress Notes (Signed)
Subjective: Postpartum Day 2: Cesarean Delivery secondary to:  Elective Repeat -  RLTCS Patient reports incisional pain, tolerating PO, + flatus and no problems voiding.  +BM no complaints, up ad lib without syncope Pain well controlled with po meds BF; on demand Mood stable, bonding well  Objective: Vital signs in last 24 hours: Temp:  [97.4 F (36.3 C)-98.6 F (37 C)] 97.4 F (36.3 C) (01/30 0547) Pulse Rate:  [73-85] 74  (01/30 0547) Resp:  [16-18] 18  (01/30 0547) BP: (110-125)/(74-78) 110/74 mmHg (01/30 0547)  Physical Exam:  General: alert, cooperative and mild distress Breasts: N/T Heart: RRR Lungs: CTAB Abdomen: BS x4 Uterine Fundus: firm, - 2/u Incision: healing well, no significant drainage, no dehiscence, no significant erythema. Clear dressing in place . Patient will return to office x 7 days for removal staples and wound check Lochia: appropriate, Min, Rubra DVT Evaluation: No evidence of DVT seen on physical exam. Negative Homan's sign. No cords or calf tenderness. No significant calf/ankle edema.   Basename 08/08/12 0530  HGB 10.7*  HCT 31.6*   Anemia of pregnancy - delivered. Continues Niferex 150 mg po daily. Baby had Cardiac Echo - normal. Can discharge to home today.  Assessment/Plan: Status post Cesarean section. Doing well postoperatively.  Discharge home with standard precautions and return to clinic in 4-6 weeks.   Earl Gala, CNM 08/09/2012, 10:13 AM

## 2012-08-10 LAB — TYPE AND SCREEN
ABO/RH(D): O POS
Unit division: 0

## 2014-05-12 ENCOUNTER — Encounter (HOSPITAL_COMMUNITY): Payer: Self-pay | Admitting: Obstetrics & Gynecology

## 2017-11-01 ENCOUNTER — Encounter: Payer: Self-pay | Admitting: Family Medicine

## 2017-11-01 ENCOUNTER — Ambulatory Visit: Payer: BLUE CROSS/BLUE SHIELD | Admitting: Family Medicine

## 2017-11-01 VITALS — BP 126/82 | HR 82 | Temp 98.5°F | Ht 60.0 in | Wt 144.0 lb

## 2017-11-01 DIAGNOSIS — Z114 Encounter for screening for human immunodeficiency virus [HIV]: Secondary | ICD-10-CM

## 2017-11-01 DIAGNOSIS — Z0001 Encounter for general adult medical examination with abnormal findings: Secondary | ICD-10-CM

## 2017-11-01 DIAGNOSIS — Z862 Personal history of diseases of the blood and blood-forming organs and certain disorders involving the immune mechanism: Secondary | ICD-10-CM | POA: Insufficient documentation

## 2017-11-01 DIAGNOSIS — R7611 Nonspecific reaction to tuberculin skin test without active tuberculosis: Secondary | ICD-10-CM

## 2017-11-01 DIAGNOSIS — Z13220 Encounter for screening for lipoid disorders: Secondary | ICD-10-CM | POA: Diagnosis not present

## 2017-11-01 DIAGNOSIS — Z9289 Personal history of other medical treatment: Secondary | ICD-10-CM | POA: Insufficient documentation

## 2017-11-01 LAB — LIPID PANEL
Cholesterol: 185 mg/dL (ref 0–200)
HDL: 79.1 mg/dL (ref >39.00)
LDL Cholesterol: 93 mg/dL (ref 0–99)
NonHDL: 105.86
TRIGLYCERIDES: 63 mg/dL (ref 0.0–149.0)
Total CHOL/HDL Ratio: 2
VLDL: 12.6 mg/dL (ref 0.0–40.0)

## 2017-11-01 LAB — COMPREHENSIVE METABOLIC PANEL
ALK PHOS: 43 U/L (ref 39–117)
ALT: 13 U/L (ref 0–35)
AST: 17 U/L (ref 0–37)
Albumin: 4.4 g/dL (ref 3.5–5.2)
BUN: 17 mg/dL (ref 6–23)
CALCIUM: 9.4 mg/dL (ref 8.4–10.5)
CO2: 29 meq/L (ref 19–32)
Chloride: 106 mEq/L (ref 96–112)
Creatinine, Ser: 0.76 mg/dL (ref 0.40–1.20)
GFR: 86.12 mL/min (ref >60.00)
Glucose, Bld: 83 mg/dL (ref 70–99)
POTASSIUM: 4.6 meq/L (ref 3.5–5.1)
Sodium: 140 mEq/L (ref 135–145)
TOTAL PROTEIN: 7.2 g/dL (ref 6.0–8.3)
Total Bilirubin: 1.6 mg/dL — ABNORMAL HIGH (ref 0.2–1.2)

## 2017-11-01 LAB — CBC
HEMATOCRIT: 32.4 % — AB (ref 36.0–46.0)
HEMOGLOBIN: 10.4 g/dL — AB (ref 12.0–15.0)
MCHC: 32.2 g/dL (ref 30.0–36.0)
MCV: 77 fl — ABNORMAL LOW (ref 78.0–100.0)
PLATELETS: 251 10*3/uL (ref 150.0–400.0)
RBC: 4.21 Mil/uL (ref 3.87–5.11)
RDW: 15 % (ref 11.5–15.5)
WBC: 4.4 10*3/uL (ref 4.0–10.5)

## 2017-11-01 NOTE — Progress Notes (Signed)
Subjective:  Julia Barrett is a 47 y.o. female who presents today for her annual comprehensive physical exam.    HPI:  She has no acute complaints today.   Lifestyle Diet: No specific diets.  Exercise: Likes to run about 2 times per week.   Depression screen PHQ 2/9 11/01/2017  Decreased Interest 0  Down, Depressed, Hopeless 0  PHQ - 2 Score 0    Health Maintenance Due  Topic Date Due  . HIV Screening  03/30/1984  . TETANUS/TDAP  03/30/1988  . PAP SMEAR  03/30/1990     ROS: A complete review of systems was negative.   PMH:  The following were reviewed and entered/updated in epic: Past Medical History:  Diagnosis Date  . No pertinent past medical history    Patient Active Problem List   Diagnosis Date Noted  . History of anemia 11/01/2017  . 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 CESAREAN SECTION    . CESAREAN SECTION  08/07/2012   Procedure: CESAREAN SECTION;  Surgeon: Genia DelMarie-Lyne Lavoie, MD;  Location: [facility];  Service: Obstetrics;  Laterality: N/A;  Repeat C/S  EDD: 08/12/12    Family History  Problem Relation Age of Onset  . Other Neg Hx   . Hypertension Neg Hx   . Hypercholesterolemia Neg Hx   . Diabetes Neg Hx     Medications- reviewed and updated No current outpatient medications on file.   No current facility-administered medications for this visit.     Allergies-reviewed and updated No Known Allergies  Social History   Socioeconomic History  . Marital status: Married    Spouse name: Not on file  . Number of children: 2  . Years of education: Not on file  . Highest education level: Not on file  Occupational History  . Not on file  Social Needs  . Financial resource strain: Not on file  . Food insecurity:    Worry: Not on file    Inability: Not on file  . Transportation needs:    Medical: Not on file    Non-medical: Not on file  Tobacco Use  . Smoking status: Never Smoker  .  Smokeless tobacco: Never Used  Substance and Sexual Activity  . Alcohol use: No  . Drug use: No  . Sexual activity: Not Currently  Lifestyle  . Physical activity:    Days per week: Not on file    Minutes per session: Not on file  . Stress: Not on file  Relationships  . Social connections:    Talks on phone: Not on file    Gets together: Not on file    Attends religious service: Not on file    Active member of club or organization: Not on file    Attends meetings of clubs or organizations: Not on file    Relationship status: Not on file  Other Topics Concern  . Not on file  Social History Narrative  . Not on file    Objective:  Physical Exam: BP 126/82 (BP Location: Left Arm)   Pulse 82   Temp 98.5 F (36.9 C)   Ht 5' (1.524 m)   Wt 144 lb (65.3 kg)   SpO2 99%   BMI 28.12 kg/m   Body mass index is 28.12 kg/m. Wt Readings from Last 3 Encounters:  11/01/17 144 lb (65.3 kg)  08/07/12 170 lb (77.1 kg)  08/01/12 170 lb (77.1 kg)   Gen: NAD,  resting comfortably HEENT: TMs normal bilaterally. OP clear. No thyromegaly noted.  CV: RRR with no murmurs appreciated Pulm: NWOB, CTAB with no crackles, wheezes, or rhonchi GI: Normal bowel sounds present. Soft, Nontender, Nondistended. MSK: no edema, cyanosis, or clubbing noted Skin: warm, dry Neuro: CN2-12 grossly intact. Strength 5/5 in upper and lower extremities. Reflexes symmetric and intact bilaterally.  Psych: Normal affect and thought content  Assessment/Plan:  History of anemia Check CBC and CMET.  History of positive PPD Check QuantiFERON.  No current symptoms.  Preventative Healthcare: Check lipid panel.  Check HIV antibody.  Obtain records from her gynecologist regarding her recent Pap smear results as well as her Tdap immunization.  Patient Counseling(The following topics were reviewed and/or handout was given):  -Nutrition: Stressed importance of moderation in sodium/caffeine intake, saturated fat and  cholesterol, caloric balance, sufficient intake of fresh fruits, vegetables, and fiber.  -Stressed the importance of regular exercise.   -Substance Abuse: Discussed cessation/primary prevention of tobacco, alcohol, or other drug use; driving or other dangerous activities under the influence; availability of treatment for abuse.   -Injury prevention: Discussed safety belts, safety helmets, smoke detector, smoking near bedding or upholstery.   -Sexuality: Discussed sexually transmitted diseases, partner selection, use of condoms, avoidance of unintended pregnancy and contraceptive alternatives.   -Dental health: Discussed importance of regular tooth brushing, flossing, and dental visits.  -Health maintenance and immunizations reviewed. Please refer to Health maintenance section.  Return to care in 1 year for next preventative visit.   Katina Degree. Jimmey Ralph, MD 11/01/2017 9:29 AM

## 2017-11-01 NOTE — Assessment & Plan Note (Signed)
Check QuantiFERON.  No current symptoms.

## 2017-11-01 NOTE — Patient Instructions (Signed)
Preventive Care 40-64 Years, Female Preventive care refers to lifestyle choices and visits with your health care provider that can promote health and wellness. What does preventive care include?  A yearly physical exam. This is also called an annual well check.  Dental exams once or twice a year.  Routine eye exams. Ask your health care provider how often you should have your eyes checked.  Personal lifestyle choices, including: ? Daily care of your teeth and gums. ? Regular physical activity. ? Eating a healthy diet. ? Avoiding tobacco and drug use. ? Limiting alcohol use. ? Practicing safe sex. ? Taking low-dose aspirin daily starting at age 58. ? Taking vitamin and mineral supplements as recommended by your health care provider. What happens during an annual well check? The services and screenings done by your health care provider during your annual well check will depend on your age, overall health, lifestyle risk factors, and family history of disease. Counseling Your health care provider may ask you questions about your:  Alcohol use.  Tobacco use.  Drug use.  Emotional well-being.  Home and relationship well-being.  Sexual activity.  Eating habits.  Work and work Statistician.  Method of birth control.  Menstrual cycle.  Pregnancy history.  Screening You may have the following tests or measurements:  Height, weight, and BMI.  Blood pressure.  Lipid and cholesterol levels. These may be checked every 5 years, or more frequently if you are over 81 years old.  Skin check.  Lung cancer screening. You may have this screening every year starting at age 78 if you have a 30-pack-year history of smoking and currently smoke or have quit within the past 15 years.  Fecal occult blood test (FOBT) of the stool. You may have this test every year starting at age 65.  Flexible sigmoidoscopy or colonoscopy. You may have a sigmoidoscopy every 5 years or a colonoscopy  every 10 years starting at age 30.  Hepatitis C blood test.  Hepatitis B blood test.  Sexually transmitted disease (STD) testing.  Diabetes screening. This is done by checking your blood sugar (glucose) after you have not eaten for a while (fasting). You may have this done every 1-3 years.  Mammogram. This may be done every 1-2 years. Talk to your health care provider about when you should start having regular mammograms. This may depend on whether you have a family history of breast cancer.  BRCA-related cancer screening. This may be done if you have a family history of breast, ovarian, tubal, or peritoneal cancers.  Pelvic exam and Pap test. This may be done every 3 years starting at age 80. Starting at age 36, this may be done every 5 years if you have a Pap test in combination with an HPV test.  Bone density scan. This is done to screen for osteoporosis. You may have this scan if you are at high risk for osteoporosis.  Discuss your test results, treatment options, and if necessary, the need for more tests with your health care provider. Vaccines Your health care provider may recommend certain vaccines, such as:  Influenza vaccine. This is recommended every year.  Tetanus, diphtheria, and acellular pertussis (Tdap, Td) vaccine. You may need a Td booster every 10 years.  Varicella vaccine. You may need this if you have not been vaccinated.  Zoster vaccine. You may need this after age 5.  Measles, mumps, and rubella (MMR) vaccine. You may need at least one dose of MMR if you were born in  1959 or later. You may also need a second dose.  Pneumococcal 13-valent conjugate (PCV13) vaccine. You may need this if you have certain conditions and were not previously vaccinated.  Pneumococcal polysaccharide (PPSV23) vaccine. You may need one or two doses if you smoke cigarettes or if you have certain conditions.  Meningococcal vaccine. You may need this if you have certain  conditions.  Hepatitis A vaccine. You may need this if you have certain conditions or if you travel or work in places where you may be exposed to hepatitis A.  Hepatitis B vaccine. You may need this if you have certain conditions or if you travel or work in places where you may be exposed to hepatitis B.  Haemophilus influenzae type b (Hib) vaccine. You may need this if you have certain conditions.  Talk to your health care provider about which screenings and vaccines you need and how often you need them. This information is not intended to replace advice given to you by your health care provider. Make sure you discuss any questions you have with your health care provider. Document Released: 07/24/2015 Document Revised: 03/16/2016 Document Reviewed: 04/28/2015 Elsevier Interactive Patient Education  2018 Elsevier Inc.  

## 2017-11-01 NOTE — Assessment & Plan Note (Signed)
Check CBC and CMET.  

## 2017-11-02 LAB — HIV ANTIBODY (ROUTINE TESTING W REFLEX): HIV 1&2 Ab, 4th Generation: NONREACTIVE

## 2017-11-03 LAB — QUANTIFERON-TB GOLD PLUS
NIL: 0.02 [IU]/mL
QUANTIFERON-TB GOLD PLUS: NEGATIVE
TB1-NIL: 0 IU/mL
TB2-NIL: 0.02 IU/mL

## 2017-11-06 ENCOUNTER — Telehealth: Payer: Self-pay | Admitting: Family Medicine

## 2017-11-06 ENCOUNTER — Ambulatory Visit: Payer: BLUE CROSS/BLUE SHIELD

## 2017-11-06 NOTE — Telephone Encounter (Signed)
Called pt, Nurse visit today at 2:15. Read TB test, pink sheet at [facility] [facility].

## 2017-11-06 NOTE — Telephone Encounter (Signed)
Patient came in inquiring about her TB results, she did not see them on mychart. Please contact patient with results, okay to leave detailed message.

## 2017-11-09 ENCOUNTER — Telehealth: Payer: Self-pay | Admitting: Family Medicine

## 2017-11-09 ENCOUNTER — Other Ambulatory Visit: Payer: Self-pay

## 2017-11-09 DIAGNOSIS — Z862 Personal history of diseases of the blood and blood-forming organs and certain disorders involving the immune mechanism: Secondary | ICD-10-CM

## 2017-11-09 NOTE — Telephone Encounter (Signed)
The patient states she recently got her lab results back on MyChart and would like to know if she needs to come back into the office. The patient would like an explanation of the results.

## 2017-11-09 NOTE — Telephone Encounter (Signed)
LVMOM on 4/29 and she picked up paperwork did not make a lab appointment for recheck. LVMOM today with same message from Dr. Jimmey Ralph. Will send to [facility] and they can schedule a lab appointment for her.

## 2017-11-10 NOTE — Progress Notes (Signed)
Dr. Jimmey Ralph would like her to f/u with a lab visit for recheck of bilirubin and also start ferrous sulfate . I have left 2 message trying to reach out and speak with her. She also can see results on my chart, but called for interpretation. Pt chart states an interpreter (Portuguese);however on her visit she was alone.   I will send a note through Mychart in case she can read and understand the message.

## 2017-11-10 NOTE — Telephone Encounter (Signed)
See note from message on 11/09/2017.

## 2017-11-13 ENCOUNTER — Other Ambulatory Visit (INDEPENDENT_AMBULATORY_CARE_PROVIDER_SITE_OTHER): Payer: BLUE CROSS/BLUE SHIELD

## 2017-11-13 DIAGNOSIS — Z862 Personal history of diseases of the blood and blood-forming organs and certain disorders involving the immune mechanism: Secondary | ICD-10-CM

## 2017-11-14 LAB — BILIRUBIN, FRACTIONATED(TOT/DIR/INDIR)
Bilirubin, Direct: 0.3 mg/dL — ABNORMAL HIGH (ref 0.0–0.2)
Indirect Bilirubin: 1.2 mg/dL (calc) (ref 0.2–1.2)
Total Bilirubin: 1.5 mg/dL — ABNORMAL HIGH (ref 0.2–1.2)

## 2017-11-14 NOTE — Progress Notes (Signed)
Dr Lavone Neri interpretation of your lab work:  Your repeat testing is NORMAL. We do not need to do any further testing at this time.   If you have any additional questions, please give [place] a call or send [place] a message through [place].  Take care, Dr Jimmey Ralph

## 2018-07-06 ENCOUNTER — Ambulatory Visit: Payer: BLUE CROSS/BLUE SHIELD | Admitting: Family Medicine

## 2018-07-06 ENCOUNTER — Encounter: Payer: Self-pay | Admitting: Family Medicine

## 2018-07-06 VITALS — BP 102/68 | HR 64 | Temp 98.1°F | Ht 60.0 in | Wt 125.4 lb

## 2018-07-06 DIAGNOSIS — R5383 Other fatigue: Secondary | ICD-10-CM | POA: Diagnosis not present

## 2018-07-06 DIAGNOSIS — J329 Chronic sinusitis, unspecified: Secondary | ICD-10-CM | POA: Diagnosis not present

## 2018-07-06 DIAGNOSIS — Z23 Encounter for immunization: Secondary | ICD-10-CM | POA: Diagnosis not present

## 2018-07-06 DIAGNOSIS — Z862 Personal history of diseases of the blood and blood-forming organs and certain disorders involving the immune mechanism: Secondary | ICD-10-CM | POA: Diagnosis not present

## 2018-07-06 LAB — COMPREHENSIVE METABOLIC PANEL
ALT: 14 U/L (ref 0–35)
AST: 13 U/L (ref 0–37)
Albumin: 4.2 g/dL (ref 3.5–5.2)
Alkaline Phosphatase: 55 U/L (ref 39–117)
BILIRUBIN TOTAL: 0.9 mg/dL (ref 0.2–1.2)
BUN: 20 mg/dL (ref 6–23)
CALCIUM: 9.3 mg/dL (ref 8.4–10.5)
CO2: 29 meq/L (ref 19–32)
CREATININE: 0.76 mg/dL (ref 0.40–1.20)
Chloride: 104 mEq/L (ref 96–112)
GFR: 85.88 mL/min (ref >60.00)
GLUCOSE: 86 mg/dL (ref 70–99)
Potassium: 4.7 mEq/L (ref 3.5–5.1)
Sodium: 140 mEq/L (ref 135–145)
Total Protein: 6.9 g/dL (ref 6.0–8.3)

## 2018-07-06 LAB — CBC
HEMATOCRIT: 35.7 % — AB (ref 36.0–46.0)
Hemoglobin: 11.4 g/dL — ABNORMAL LOW (ref 12.0–15.0)
MCHC: 32 g/dL (ref 30.0–36.0)
MCV: 78 fl (ref 78.0–100.0)
PLATELETS: 240 10*3/uL (ref 150.0–400.0)
RBC: 4.58 Mil/uL (ref 3.87–5.11)
RDW: 17.1 % — ABNORMAL HIGH (ref 11.5–15.5)
WBC: 5.2 10*3/uL (ref 4.0–10.5)

## 2018-07-06 LAB — TSH: TSH: 2.64 u[IU]/mL (ref 0.35–4.50)

## 2018-07-06 LAB — VITAMIN B12: VITAMIN B 12: 330 pg/mL (ref 211–911)

## 2018-07-06 MED ORDER — AZITHROMYCIN 250 MG PO TABS: ORAL_TABLET | ORAL | 0 refills | Status: AC

## 2018-07-06 MED ORDER — IPRATROPIUM BROMIDE 0.06 % NA SOLN: 2.0000 | Freq: Four times a day (QID) | NASAL | 0 refills | Status: AC

## 2018-07-06 NOTE — Addendum Note (Signed)
Addended by: Erick AlleyANDERSON, Rami Budhu on: 07/06/2018 09:12 AM   Modules accepted: Orders

## 2018-07-06 NOTE — Patient Instructions (Signed)
Start the atrovent.  Start the zpack if your symptoms worsen or do not improve in a few days.  Please stay well hydrated.  You can take tylenol and/or motrin as needed for low grade fever and pain.  Please let me know if your symptoms worsen or fail to improve.  We will give your flu shot today.  Please take ferrous sulfate 325 mg every other day to help with your iron levels.  We will check blood work today to check your iron levels and any other potential causes of fatigue.  Take care, Dr Jimmey RalphParker

## 2018-07-06 NOTE — Progress Notes (Signed)
6 

## 2018-07-06 NOTE — Assessment & Plan Note (Signed)
Restart ferrous sulfate 325 mg every other day.  Check iron panel and CBC.

## 2018-07-06 NOTE — Progress Notes (Signed)
   Subjective:  Julia Barrett is a 47 y.o. female who presents today with a chief complaint of fatigue.   HPI:  Fatigue/anemia, chronic problem, worsening She has noticed worsening fatigue symptoms over the last 1 to 2 months.  Patient thinks that this is due to worsening of her anemia.  She has not been taking iron supplements and has additionally stopped eating meat.  Feels very tired during the day.  Feels like she is sleeping a lot.  She still has regular menstrual cycles.  Sinus Congestion, acute problem Started 3 days ago.  Worsening over that time.  Associated symptoms include ear pain/pressure, rhinorrhea, cough, sneeze, and sputum production.  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  She has not tried any treatments for this. No other obvious alleviating or aggravating factors.   ROS: Per HPI  PMH: She reports that she has never smoked. She has never used smokeless tobacco. She reports that she does not drink alcohol or use drugs.  Objective:  Physical Exam: BP 102/68 (BP Location: Left Arm, Patient Position: Sitting, Cuff Size: Normal)   Pulse 64   Temp 98.1 F (36.7 C) (Oral)   Ht 5' (1.524 m)   Wt 125 lb 6.1 oz (56.9 kg)   LMP 06/21/2018   SpO2 99%   BMI 24.49 kg/m   Gen: NAD, resting comfortably HEENT: TMs with clear effusion.  OP erythematous with no exudate.  Nasal coast erythematous and boggy bilaterally. CV: RRR with no murmurs appreciated Pulm: NWOB, CTAB with no crackles, wheezes, or rhonchi   Assessment/Plan:  History of anemia Restart ferrous sulfate 325 mg every other day.  Check iron panel and CBC.  Sinusitis No signs of bacterial infection.  Start Atrovent nasal spray.  Sent in a "pocket prescription" for azithromycin with strict instruction to not start with symptoms worsen or not improve in the next few days.  Encouraged good oral hydration.  Recommended Tylenol and/or Motrin as needed.  Discussed reasons return to  care.  Fatigue Likely secondary to iron deficiency anemia.  Recommended patient restart ferrous sulfate 325 mg every other day.  Check iron panel, CBC, CMP, TSH, and B12.  Katina Degreealeb M. Jimmey RalphParker, MD 07/06/2018 8:50 AM

## 2018-07-07 LAB — IRON,TIBC AND FERRITIN PANEL
%SAT: 9 % — AB (ref 16–45)
Ferritin: 6 ng/mL — ABNORMAL LOW (ref 16–232)
IRON: 42 ug/dL (ref 40–190)
TIBC: 448 ug/dL (ref 250–450)

## 2018-07-09 NOTE — Progress Notes (Signed)
Please inform patient of the following:  Her iron counts are very low, as she suspected. Please make sure that she is taking ferrous sulfate 325mg  every other day. Ideally should be taken on an empty stomach with vitamin C to maximize absorption. We should recheck in 3-6 months.  Julia Barrett M. Jimmey RalphParker, MD 07/09/2018 8:04 AM

## 2022-04-04 ENCOUNTER — Encounter: Payer: Self-pay | Admitting: *Deleted
